# Patient Record
Sex: Male | Born: 1995 | Race: Black or African American | Hispanic: No | Marital: Single | Smoking: Current every day smoker
Health system: Southern US, Community
[De-identification: ages and names within clinical notes are randomized; demographics above are authoritative.]

## PROBLEM LIST (undated history)

## (undated) DIAGNOSIS — A64 Unspecified sexually transmitted disease: Secondary | ICD-10-CM

---
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
## 2017-11-02 ENCOUNTER — Emergency Department (HOSPITAL_COMMUNITY)
Admission: EM | Admit: 2017-11-02 | Discharge: 2017-11-03 | Disposition: A | Discharge status: Home / Self Care | Payer: Self-pay | Attending: Emergency Medicine | Admitting: Emergency Medicine

## 2017-11-02 DIAGNOSIS — M5441 Lumbago with sciatica, right side: Secondary | ICD-10-CM | POA: Insufficient documentation

## 2017-11-02 DIAGNOSIS — R300 Dysuria: Secondary | ICD-10-CM | POA: Insufficient documentation

## 2017-11-02 DIAGNOSIS — M5442 Lumbago with sciatica, left side: Secondary | ICD-10-CM | POA: Insufficient documentation

## 2017-11-02 DIAGNOSIS — G8929 Other chronic pain: Secondary | ICD-10-CM

## 2017-11-02 DIAGNOSIS — F17210 Nicotine dependence, cigarettes, uncomplicated: Secondary | ICD-10-CM | POA: Insufficient documentation

## 2017-11-02 HISTORY — DX: Unspecified sexually transmitted disease: A64

## 2017-11-03 ENCOUNTER — Encounter (HOSPITAL_COMMUNITY): Payer: Self-pay | Admitting: Emergency Medicine

## 2017-11-03 LAB — CBC
HEMATOCRIT: 47.5 % (ref 39.0–52.0)
Hemoglobin: 15.8 g/dL (ref 13.0–17.0)
MCH: 32.2 pg (ref 26.0–34.0)
MCHC: 33.3 g/dL (ref 30.0–36.0)
MCV: 96.7 fL (ref 78.0–100.0)
PLATELETS: 295 10*3/uL (ref 150–400)
RBC: 4.91 MIL/uL (ref 4.22–5.81)
RDW: 12.2 % (ref 11.5–15.5)
WBC: 10.3 10*3/uL (ref 4.0–10.5)

## 2017-11-03 LAB — URINALYSIS, ROUTINE W REFLEX MICROSCOPIC
BACTERIA UA: NONE SEEN
Bilirubin Urine: NEGATIVE
GLUCOSE, UA: NEGATIVE mg/dL
KETONES UR: 5 mg/dL — AB
Nitrite: NEGATIVE
PH: 6 (ref 5.0–8.0)
PROTEIN: 30 mg/dL — AB
Specific Gravity, Urine: 1.032 — ABNORMAL HIGH (ref 1.005–1.030)

## 2017-11-03 LAB — COMPREHENSIVE METABOLIC PANEL
ALT: 11 U/L (ref 0–44)
ANION GAP: 9 (ref 5–15)
AST: 20 U/L (ref 15–41)
Albumin: 4.1 g/dL (ref 3.5–5.0)
Alkaline Phosphatase: 69 U/L (ref 38–126)
BILIRUBIN TOTAL: 1.1 mg/dL (ref 0.3–1.2)
BUN: 11 mg/dL (ref 6–20)
CHLORIDE: 105 mmol/L (ref 98–111)
CO2: 26 mmol/L (ref 22–32)
Calcium: 8.9 mg/dL (ref 8.9–10.3)
Creatinine, Ser: 1.19 mg/dL (ref 0.61–1.24)
Glucose, Bld: 91 mg/dL (ref 70–99)
Potassium: 3.8 mmol/L (ref 3.5–5.1)
Sodium: 140 mmol/L (ref 135–145)
TOTAL PROTEIN: 7.3 g/dL (ref 6.5–8.1)

## 2017-11-03 LAB — LIPASE, BLOOD: Lipase: 24 U/L (ref 11–51)

## 2017-11-03 MED ORDER — CEFTRIAXONE SODIUM 250 MG IJ SOLR
250.0000 mg | Freq: Once | INTRAMUSCULAR | Status: AC
  Administered 2017-11-03: 250 mg via INTRAMUSCULAR
  Filled 2017-11-03: qty 250

## 2017-11-03 MED ORDER — CEPHALEXIN 500 MG PO CAPS: 500.0000 mg | ORAL_CAPSULE | Freq: Two times a day (BID) | ORAL | 0 refills | Status: DC

## 2017-11-03 MED ORDER — STERILE WATER FOR INJECTION IJ SOLN
INTRAMUSCULAR | Status: AC
  Administered 2017-11-03: 0.9 mL
  Filled 2017-11-03: qty 10

## 2017-11-03 MED ORDER — AZITHROMYCIN 250 MG PO TABS
1000.0000 mg | ORAL_TABLET | Freq: Once | ORAL | Status: AC
  Administered 2017-11-03: 1000 mg via ORAL
  Filled 2017-11-03: qty 4
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
## 2017-12-22 ENCOUNTER — Emergency Department (HOSPITAL_COMMUNITY): Payer: 59

## 2017-12-22 ENCOUNTER — Encounter (HOSPITAL_COMMUNITY): Payer: Self-pay | Admitting: *Deleted

## 2017-12-22 ENCOUNTER — Observation Stay (HOSPITAL_COMMUNITY)
Admission: EM | Admit: 2017-12-22 | Discharge: 2017-12-23 | Disposition: A | Discharge status: Home / Self Care | Payer: 59 | Attending: General Surgery | Admitting: General Surgery

## 2017-12-22 ENCOUNTER — Emergency Department (HOSPITAL_COMMUNITY): Payer: 59 | Admitting: Certified Registered"

## 2017-12-22 ENCOUNTER — Encounter (HOSPITAL_COMMUNITY): Admission: EM | Disposition: A | Discharge status: Home / Self Care | Payer: Self-pay | Source: Home / Self Care

## 2017-12-22 DIAGNOSIS — W3400XA Accidental discharge from unspecified firearms or gun, initial encounter: Secondary | ICD-10-CM | POA: Diagnosis not present

## 2017-12-22 DIAGNOSIS — F17210 Nicotine dependence, cigarettes, uncomplicated: Secondary | ICD-10-CM | POA: Diagnosis not present

## 2017-12-22 DIAGNOSIS — S31134A Puncture wound of abdominal wall without foreign body, left lower quadrant without penetration into peritoneal cavity, initial encounter: Secondary | ICD-10-CM | POA: Diagnosis present

## 2017-12-22 HISTORY — PX: LAPAROSCOPY: SHX197

## 2017-12-22 LAB — COMPREHENSIVE METABOLIC PANEL
ALT: 21 U/L (ref 0–44)
AST: 25 U/L (ref 15–41)
Albumin: 4.2 g/dL (ref 3.5–5.0)
Alkaline Phosphatase: 73 U/L (ref 38–126)
Anion gap: 15 (ref 5–15)
BUN: 10 mg/dL (ref 6–20)
CHLORIDE: 104 mmol/L (ref 98–111)
CO2: 21 mmol/L — ABNORMAL LOW (ref 22–32)
Calcium: 8.9 mg/dL (ref 8.9–10.3)
Creatinine, Ser: 1.18 mg/dL (ref 0.61–1.24)
Glucose, Bld: 118 mg/dL — ABNORMAL HIGH (ref 70–99)
POTASSIUM: 3.1 mmol/L — AB (ref 3.5–5.1)
SODIUM: 140 mmol/L (ref 135–145)
Total Bilirubin: 0.8 mg/dL (ref 0.3–1.2)
Total Protein: 7.7 g/dL (ref 6.5–8.1)

## 2017-12-22 LAB — PROTIME-INR
INR: 0.97
PROTHROMBIN TIME: 12.8 s (ref 11.4–15.2)

## 2017-12-22 LAB — I-STAT CHEM 8, ED
BUN: 10 mg/dL (ref 6–20)
CREATININE: 1 mg/dL (ref 0.61–1.24)
Calcium, Ion: 1.11 mmol/L — ABNORMAL LOW (ref 1.15–1.40)
Chloride: 104 mmol/L (ref 98–111)
GLUCOSE: 117 mg/dL — AB (ref 70–99)
HCT: 50 % (ref 39.0–52.0)
HEMOGLOBIN: 17 g/dL (ref 13.0–17.0)
Potassium: 2.9 mmol/L — ABNORMAL LOW (ref 3.5–5.1)
Sodium: 143 mmol/L (ref 135–145)
TCO2: 22 mmol/L (ref 22–32)

## 2017-12-22 LAB — CBC
HEMATOCRIT: 48.1 % (ref 39.0–52.0)
HEMOGLOBIN: 15.8 g/dL (ref 13.0–17.0)
MCH: 32.4 pg (ref 26.0–34.0)
MCHC: 32.8 g/dL (ref 30.0–36.0)
MCV: 98.6 fL (ref 78.0–100.0)
Platelets: 326 10*3/uL (ref 150–400)
RBC: 4.88 MIL/uL (ref 4.22–5.81)
RDW: 12.5 % (ref 11.5–15.5)
WBC: 12.2 10*3/uL — ABNORMAL HIGH (ref 4.0–10.5)

## 2017-12-22 LAB — I-STAT CG4 LACTIC ACID, ED: Lactic Acid, Venous: 5.57 mmol/L (ref 0.5–1.9)

## 2017-12-22 LAB — ABO/RH: ABO/RH(D): A POS

## 2017-12-22 LAB — ETHANOL: Alcohol, Ethyl (B): <10 mg/dL (ref <10)

## 2017-12-22 SURGERY — LAPAROSCOPY, DIAGNOSTIC
Anesthesia: General | Site: Abdomen

## 2017-12-22 MED ORDER — SODIUM CHLORIDE 0.9 % IV SOLN
2.0000 g | Freq: Once | INTRAVENOUS | Status: AC
  Administered 2017-12-22: 2 g via INTRAVENOUS
  Filled 2017-12-22: qty 2

## 2017-12-22 MED ORDER — ONDANSETRON HCL 4 MG/2ML IJ SOLN
INTRAMUSCULAR | Status: DC | PRN
  Administered 2017-12-22: 4 mg via INTRAVENOUS

## 2017-12-22 MED ORDER — TETANUS-DIPHTH-ACELL PERTUSSIS 5-2.5-18.5 LF-MCG/0.5 IM SUSP
INTRAMUSCULAR | Status: AC
  Filled 2017-12-22: qty 0.5

## 2017-12-22 MED ORDER — TETANUS-DIPHTH-ACELL PERTUSSIS 5-2.5-18.5 LF-MCG/0.5 IM SUSP
INTRAMUSCULAR | Status: AC
  Administered 2017-12-22: 0.5 mL via INTRAMUSCULAR
  Filled 2017-12-22: qty 0.5

## 2017-12-22 MED ORDER — FENTANYL CITRATE (PF) 250 MCG/5ML IJ SOLN
INTRAMUSCULAR | Status: AC
  Filled 2017-12-22: qty 5

## 2017-12-22 MED ORDER — FENTANYL CITRATE (PF) 250 MCG/5ML IJ SOLN
INTRAMUSCULAR | Status: DC | PRN
  Administered 2017-12-22 (×2): 50 ug via INTRAVENOUS

## 2017-12-22 MED ORDER — MIDAZOLAM HCL 2 MG/2ML IJ SOLN
INTRAMUSCULAR | Status: AC
  Filled 2017-12-22: qty 2

## 2017-12-22 MED ORDER — MIDAZOLAM HCL 5 MG/5ML IJ SOLN
INTRAMUSCULAR | Status: DC | PRN
  Administered 2017-12-22: 2 mg via INTRAVENOUS

## 2017-12-22 MED ORDER — SUCCINYLCHOLINE CHLORIDE 20 MG/ML IJ SOLN
INTRAMUSCULAR | Status: DC | PRN
  Administered 2017-12-22: 120 mg via INTRAVENOUS

## 2017-12-22 MED ORDER — ACETAMINOPHEN 10 MG/ML IV SOLN
INTRAVENOUS | Status: DC | PRN
  Administered 2017-12-22: 1000 mg via INTRAVENOUS

## 2017-12-22 MED ORDER — LACTATED RINGERS IV SOLN
INTRAVENOUS | Status: DC | PRN
  Administered 2017-12-22: 23:00:00 via INTRAVENOUS

## 2017-12-22 MED ORDER — PROPOFOL 10 MG/ML IV BOLUS
INTRAVENOUS | Status: DC | PRN
  Administered 2017-12-22: 160 mg via INTRAVENOUS

## 2017-12-22 MED ORDER — 0.9 % SODIUM CHLORIDE (POUR BTL) OPTIME
TOPICAL | Status: DC | PRN
  Administered 2017-12-22: 1000 mL

## 2017-12-22 MED ORDER — PROPOFOL 10 MG/ML IV BOLUS
INTRAVENOUS | Status: AC
  Filled 2017-12-22: qty 20

## 2017-12-22 MED ORDER — ROCURONIUM BROMIDE 100 MG/10ML IV SOLN
INTRAVENOUS | Status: DC | PRN
  Administered 2017-12-22: 30 mg via INTRAVENOUS

## 2017-12-22 MED ORDER — BUPIVACAINE HCL 0.25 % IJ SOLN
INTRAMUSCULAR | Status: DC | PRN
  Administered 2017-12-22: 5 mL

## 2017-12-22 MED ORDER — LIDOCAINE HCL (CARDIAC) PF 100 MG/5ML IV SOSY
PREFILLED_SYRINGE | INTRAVENOUS | Status: DC | PRN
  Administered 2017-12-22: 60 mg via INTRATRACHEAL

## 2017-12-22 MED ORDER — FENTANYL CITRATE (PF) 100 MCG/2ML IJ SOLN
INTRAMUSCULAR | Status: AC
  Administered 2017-12-22: 50 ug
  Filled 2017-12-22: qty 2

## 2017-12-22 MED ORDER — SUGAMMADEX SODIUM 200 MG/2ML IV SOLN
INTRAVENOUS | Status: DC | PRN
  Administered 2017-12-22: 200 mg via INTRAVENOUS

## 2017-12-22 SURGICAL SUPPLY — 44 items
ADH SKN CLS APL DERMABOND .7 (GAUZE/BANDAGES/DRESSINGS) ×1
APL SKNCLS STERI-STRIP NONHPOA (GAUZE/BANDAGES/DRESSINGS) ×1
BENZOIN TINCTURE PRP APPL 2/3 (GAUZE/BANDAGES/DRESSINGS) ×3 IMPLANT
BLADE CLIPPER SURG (BLADE) IMPLANT
CANISTER SUCT 3000ML PPV (MISCELLANEOUS) IMPLANT
CHLORAPREP W/TINT 26ML (MISCELLANEOUS) ×3 IMPLANT
COVER SURGICAL LIGHT HANDLE (MISCELLANEOUS) ×3 IMPLANT
DEFOGGER SCOPE WARMER CLEARIFY (MISCELLANEOUS) ×3 IMPLANT
DERMABOND ADVANCED (GAUZE/BANDAGES/DRESSINGS) ×2
DERMABOND ADVANCED .7 DNX12 (GAUZE/BANDAGES/DRESSINGS) IMPLANT
DRAPE WARM FLUID 44X44 (DRAPE) ×3 IMPLANT
ELECT REM PT RETURN 9FT ADLT (ELECTROSURGICAL) ×3
ELECTRODE REM PT RTRN 9FT ADLT (ELECTROSURGICAL) ×1 IMPLANT
GAUZE SPONGE 2X2 8PLY STRL LF (GAUZE/BANDAGES/DRESSINGS) ×1 IMPLANT
GAUZE SPONGE 4X4 12PLY STRL (GAUZE/BANDAGES/DRESSINGS) ×2 IMPLANT
GLOVE BIO SURGEON STRL SZ7.5 (GLOVE) ×5 IMPLANT
GOWN STRL REUS W/ TWL LRG LVL3 (GOWN DISPOSABLE) ×2 IMPLANT
GOWN STRL REUS W/ TWL XL LVL3 (GOWN DISPOSABLE) ×1 IMPLANT
GOWN STRL REUS W/TWL LRG LVL3 (GOWN DISPOSABLE) ×6
GOWN STRL REUS W/TWL XL LVL3 (GOWN DISPOSABLE) ×3
KIT BASIN OR (CUSTOM PROCEDURE TRAY) ×3 IMPLANT
KIT TURNOVER KIT B (KITS) ×3 IMPLANT
NDL INSUFFLATION 14GA 120MM (NEEDLE) ×1 IMPLANT
NEEDLE INSUFFLATION 14GA 120MM (NEEDLE) ×6 IMPLANT
NS IRRIG 1000ML POUR BTL (IV SOLUTION) ×3 IMPLANT
PAD ARMBOARD 7.5X6 YLW CONV (MISCELLANEOUS) ×6 IMPLANT
PENCIL BUTTON HOLSTER BLD 10FT (ELECTRODE) ×2 IMPLANT
SCISSORS LAP 5X35 DISP (ENDOMECHANICALS) IMPLANT
SET IRRIG TUBING LAPAROSCOPIC (IRRIGATION / IRRIGATOR) IMPLANT
SET TUBE SMOKE EVAC HIGH FLOW (TUBING) ×2 IMPLANT
SLEEVE ENDOPATH XCEL 5M (ENDOMECHANICALS) ×5 IMPLANT
SPONGE GAUZE 2X2 STER 10/PKG (GAUZE/BANDAGES/DRESSINGS) ×2
SUT MNCRL AB 4-0 PS2 18 (SUTURE) ×5 IMPLANT
TOWEL OR 17X24 6PK STRL BLUE (TOWEL DISPOSABLE) ×3 IMPLANT
TOWEL OR 17X26 10 PK STRL BLUE (TOWEL DISPOSABLE) ×3 IMPLANT
TRAY LAPAROSCOPIC MC (CUSTOM PROCEDURE TRAY) ×3 IMPLANT
TROCAR XCEL 12X100 BLDLESS (ENDOMECHANICALS) IMPLANT
TROCAR XCEL BLUNT TIP 100MML (ENDOMECHANICALS) IMPLANT
TROCAR XCEL NON-BLD 11X100MML (ENDOMECHANICALS) IMPLANT
TROCAR XCEL NON-BLD 5MMX100MML (ENDOMECHANICALS) ×6 IMPLANT
TUBE CONNECTING 12'X1/4 (SUCTIONS) ×1
TUBE CONNECTING 12X1/4 (SUCTIONS) ×1 IMPLANT
TUBING INSUFFLATION (TUBING) ×3 IMPLANT
YANKAUER SUCT BULB TIP NO VENT (SUCTIONS) ×3 IMPLANT

## 2017-12-22 NOTE — Anesthesia Procedure Notes (Addendum)
Procedure Name: Intubation Date/Time: 12/22/2017 11:28 PM Performed by: Claudina LickMahony, Corinthian Mizrahi D, CRNA Pre-anesthesia Checklist: Patient identified, Emergency Drugs available, Suction available, Patient being monitored and Timeout performed Patient Re-evaluated:Patient Re-evaluated prior to induction Oxygen Delivery Method: Circle system utilized Preoxygenation: Pre-oxygenation with 100% oxygen Induction Type: IV induction, Rapid sequence and Cricoid Pressure applied Laryngoscope Size: Miller and 2 Grade View: Grade I Tube type: Subglottic suction tube Tube size: 7.5 mm Number of attempts: 1 Airway Equipment and Method: Stylet Placement Confirmation: ETT inserted through vocal cords under direct vision,  positive ETCO2 and breath sounds checked- equal and bilateral Secured at: 22 cm Tube secured with: Tape Dental Injury: Teeth and Oropharynx as per pre-operative assessment
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
## 2017-12-22 NOTE — Anesthesia Preprocedure Evaluation (Addendum)
Anesthesia Evaluation  Patient identified by MRN, date of birth, ID band Patient awake    Reviewed: Allergy & Precautions, Patient's Chart, lab work & pertinent test resultsPreop documentation limited or incomplete due to emergent nature of procedure.  History of Anesthesia Complications Negative for: history of anesthetic complications  Airway Mallampati: II  TM Distance: >3 FB Neck ROM: Full    Dental  (+) Teeth Intact, Dental Advisory Given   Pulmonary neg pulmonary ROS, Current Smoker,           Cardiovascular negative cardio ROS       Neuro/Psych negative neurological ROS  negative psych ROS   GI/Hepatic Neg liver ROS,   Endo/Other  negative endocrine ROS  Renal/GU negative Renal ROS  negative genitourinary   Musculoskeletal negative musculoskeletal ROS (+)   Abdominal   Peds negative pediatric ROS (+)  Hematology negative hematology ROS (+)   Anesthesia Other Findings   Reproductive/Obstetrics negative OB ROS                            Anesthesia Physical Anesthesia Plan  ASA: III and emergent  Anesthesia Plan: General   Post-op Pain Management:    Induction: Intravenous, Rapid sequence and Cricoid pressure planned  PONV Risk Score and Plan: 2 and Ondansetron and Dexamethasone  Airway Management Planned: Oral ETT  Additional Equipment:   Intra-op Plan:   Post-operative Plan: Extubation in OR  Informed Consent: I have reviewed the patients History and Physical, chart, labs and discussed the procedure including the risks, benefits and alternatives for the proposed anesthesia with the patient or authorized representative who has indicated his/her understanding and acceptance.   Dental advisory given  Plan Discussed with: Anesthesiologist, CRNA and Surgeon  Anesthesia Plan Comments:        Anesthesia Quick Evaluation

## 2017-12-22 NOTE — ED Notes (Signed)
Password of [phone] given to mother at bedside
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
## 2017-12-23 ENCOUNTER — Encounter (HOSPITAL_COMMUNITY): Payer: Self-pay | Admitting: General Surgery

## 2017-12-23 ENCOUNTER — Other Ambulatory Visit: Payer: Self-pay

## 2017-12-23 DIAGNOSIS — Y249XXA Unspecified firearm discharge, undetermined intent, initial encounter: Secondary | ICD-10-CM

## 2017-12-23 DIAGNOSIS — W3400XA Accidental discharge from unspecified firearms or gun, initial encounter: Secondary | ICD-10-CM

## 2017-12-23 LAB — BPAM RBC
BLOOD PRODUCT EXPIRATION DATE: 201909102359
BLOOD PRODUCT EXPIRATION DATE: 201909112359
ISSUE DATE / TIME: 201908132236
ISSUE DATE / TIME: 201908132236
UNIT TYPE AND RH: 9500
Unit Type and Rh: 9500

## 2017-12-23 LAB — CBC
HEMATOCRIT: 44.5 % (ref 39.0–52.0)
Hemoglobin: 15 g/dL (ref 13.0–17.0)
MCH: 32.1 pg (ref 26.0–34.0)
MCHC: 33.7 g/dL (ref 30.0–36.0)
MCV: 95.1 fL (ref 78.0–100.0)
Platelets: 221 10*3/uL (ref 150–400)
RBC: 4.68 MIL/uL (ref 4.22–5.81)
RDW: 12.4 % (ref 11.5–15.5)
WBC: 11.9 10*3/uL — AB (ref 4.0–10.5)

## 2017-12-23 LAB — TYPE AND SCREEN
ABO/RH(D): A POS
ANTIBODY SCREEN: NEGATIVE
UNIT DIVISION: 0
Unit division: 0

## 2017-12-23 LAB — BPAM FFP
BLOOD PRODUCT EXPIRATION DATE: 201908312359
Blood Product Expiration Date: 201908202359
ISSUE DATE / TIME: 201908132237
ISSUE DATE / TIME: 201908132237
UNIT TYPE AND RH: 6200
Unit Type and Rh: 6200

## 2017-12-23 LAB — PREPARE FRESH FROZEN PLASMA
UNIT DIVISION: 0
Unit division: 0

## 2017-12-23 LAB — LACTIC ACID, PLASMA: Lactic Acid, Venous: 1.4 mmol/L (ref 0.5–1.9)

## 2017-12-23 LAB — CDS SEROLOGY

## 2017-12-23 MED ORDER — ACETAMINOPHEN 325 MG PO TABS: 650.0000 mg | ORAL_TABLET | Freq: Four times a day (QID) | ORAL | Status: AC | PRN

## 2017-12-23 MED ORDER — ONDANSETRON 4 MG PO TBDP: 4.0000 mg | ORAL_TABLET | Freq: Four times a day (QID) | ORAL | Status: DC | PRN

## 2017-12-23 MED ORDER — OXYCODONE HCL 5 MG PO TABS: 5.0000 mg | ORAL_TABLET | Freq: Four times a day (QID) | ORAL | 0 refills | Status: AC | PRN

## 2017-12-23 MED ORDER — PROMETHAZINE HCL 25 MG/ML IJ SOLN: 6.2500 mg | INTRAMUSCULAR | Status: DC | PRN

## 2017-12-23 MED ORDER — ONDANSETRON HCL 4 MG/2ML IJ SOLN: 4.0000 mg | Freq: Four times a day (QID) | INTRAMUSCULAR | Status: DC | PRN

## 2017-12-23 MED ORDER — DEXTROSE-NACL 5-0.9 % IV SOLN
INTRAVENOUS | Status: DC
  Administered 2017-12-23: 03:00:00 via INTRAVENOUS

## 2017-12-23 MED ORDER — ENOXAPARIN SODIUM 40 MG/0.4ML ~~LOC~~ SOLN: 40.0000 mg | SUBCUTANEOUS | Status: DC

## 2017-12-23 MED ORDER — HYDROMORPHONE HCL 1 MG/ML IJ SOLN
INTRAMUSCULAR | Status: AC
  Filled 2017-12-23: qty 1

## 2017-12-23 MED ORDER — ACETAMINOPHEN 325 MG PO TABS: 650.0000 mg | ORAL_TABLET | Freq: Four times a day (QID) | ORAL | Status: DC | PRN

## 2017-12-23 MED ORDER — TRAMADOL HCL 50 MG PO TABS
50.0000 mg | ORAL_TABLET | Freq: Four times a day (QID) | ORAL | Status: DC | PRN
  Administered 2017-12-23: 50 mg via ORAL
  Filled 2017-12-23: qty 1

## 2017-12-23 MED ORDER — HYDROMORPHONE HCL 1 MG/ML IJ SOLN
0.2500 mg | INTRAMUSCULAR | Status: DC | PRN
  Administered 2017-12-23: 0.5 mg via INTRAVENOUS

## 2017-12-23 MED ORDER — KETOROLAC TROMETHAMINE 30 MG/ML IJ SOLN
30.0000 mg | Freq: Four times a day (QID) | INTRAMUSCULAR | Status: DC | PRN
  Administered 2017-12-23: 30 mg via INTRAVENOUS
  Filled 2017-12-23: qty 1

## 2017-12-23 MED ORDER — OXYCODONE HCL 5 MG PO TABS: 5.0000 mg | ORAL_TABLET | Freq: Four times a day (QID) | ORAL | Status: DC | PRN

## 2017-12-23 NOTE — Progress Notes (Signed)
Discharge instructions (including medications) discussed with and copy provided to patient and mother. All belongings sent with patient.

## 2017-12-23 NOTE — Discharge Instructions (Signed)
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
## 2017-12-23 NOTE — Anesthesia Postprocedure Evaluation (Signed)
Anesthesia Post Note  Patient: Genene ChurnMarquel J XXXMaynard  Procedure(s) Performed: LAPAROSCOPY DIAGNOSTIC (N/A Abdomen)     Patient location during evaluation: PACU Anesthesia Type: General Level of consciousness: sedated Pain management: pain level controlled Vital Signs Assessment: post-procedure vital signs reviewed and stable Respiratory status: spontaneous breathing and respiratory function stable Cardiovascular status: stable Postop Assessment: no apparent nausea or vomiting Anesthetic complications: no                 Jahmeek Shirk DANIEL

## 2017-12-23 NOTE — Transfer of Care (Signed)
Immediate Anesthesia Transfer of Care Note  Patient: Jackson Ramirez  Procedure(s) Performed: LAPAROSCOPY DIAGNOSTIC (N/A Abdomen)  Patient Location: PACU  Anesthesia Type:General  Level of Consciousness: awake  Airway & Oxygen Therapy: Patient Spontanous Breathing  Post-op Assessment: Report given to RN and Post -op Vital signs reviewed and stable  Post vital signs: Reviewed and stable  Last Vitals:  Vitals Value Taken Time  BP 143/89 12/23/2017 12:05 AM  Temp    Pulse 85 12/23/2017 12:06 AM  Resp 27 12/23/2017 12:06 AM  SpO2 100 % 12/23/2017 12:06 AM  Vitals shown include unvalidated device data.  Last Pain:  Vitals:   12/22/17 2239  TempSrc:   PainSc: 8          Complications: No apparent anesthesia complications
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
## 2020-05-17 ENCOUNTER — Other Ambulatory Visit: Payer: Self-pay

## 2024-02-17 ENCOUNTER — Encounter (HOSPITAL_COMMUNITY): Payer: Self-pay

## 2024-02-17 ENCOUNTER — Emergency Department (HOSPITAL_COMMUNITY): Payer: MEDICAID

## 2024-02-17 ENCOUNTER — Other Ambulatory Visit: Payer: Self-pay

## 2024-02-17 ENCOUNTER — Emergency Department (HOSPITAL_COMMUNITY)
Admission: EM | Admit: 2024-02-17 | Discharge: 2024-02-17 | Disposition: A | Discharge status: Home / Self Care | Payer: MEDICAID

## 2024-02-17 DIAGNOSIS — D72829 Elevated white blood cell count, unspecified: Secondary | ICD-10-CM | POA: Insufficient documentation

## 2024-02-17 DIAGNOSIS — R0789 Other chest pain: Secondary | ICD-10-CM | POA: Diagnosis present

## 2024-02-17 LAB — TROPONIN I (HIGH SENSITIVITY)
Troponin I (High Sensitivity): 5 ng/L (ref <18)
Troponin I (High Sensitivity): 4 ng/L (ref <18)

## 2024-02-17 LAB — CBC
HCT: 44.9 % (ref 39.0–52.0)
Hemoglobin: 14.9 g/dL (ref 13.0–17.0)
MCH: 31.6 pg (ref 26.0–34.0)
MCHC: 33.2 g/dL (ref 30.0–36.0)
MCV: 95.1 fL (ref 80.0–100.0)
Platelets: 347 K/uL (ref 150–400)
RBC: 4.72 MIL/uL (ref 4.22–5.81)
RDW: 12.7 % (ref 11.5–15.5)
WBC: 11.8 K/uL — ABNORMAL HIGH (ref 4.0–10.5)
nRBC: 0 % (ref 0.0–0.2)

## 2024-02-17 LAB — BASIC METABOLIC PANEL WITH GFR
Anion gap: 9 (ref 5–15)
BUN: 11 mg/dL (ref 6–20)
CO2: 24 mmol/L (ref 22–32)
Calcium: 8.7 mg/dL — ABNORMAL LOW (ref 8.9–10.3)
Chloride: 104 mmol/L (ref 98–111)
Creatinine, Ser: 0.92 mg/dL (ref 0.61–1.24)
GFR, Estimated: >60 mL/min (ref >60)
Glucose, Bld: 93 mg/dL (ref 70–99)
Potassium: 4 mmol/L (ref 3.5–5.1)
Sodium: 137 mmol/L (ref 135–145)

## 2024-02-17 MED ORDER — IOHEXOL 350 MG/ML SOLN
75.0000 mL | Freq: Once | INTRAVENOUS | Status: AC | PRN
  Administered 2024-02-17: 75 mL via INTRAVENOUS

## 2024-02-17 NOTE — ED Triage Notes (Signed)
 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 He is on blood thinners, currently as well. Pt is otherwise stable at this time.

## 2024-02-17 NOTE — ED Provider Notes (Signed)
 [place] EMERGENCY DEPARTMENT AT [facility] Provider Note   CSN: [id number] Arrival date & time: 02/17/24  0109     Patient presents with: Chest Pain   EWARD RUTIGLIANO is a 28 y.o. male.   28 year old male presents for evaluation of chest pain.  States has been going on for a few days is located in the center of his chest and nonradiating.  States it is constant and sharp.  He states he has a history of PE and is concerned he has another 1.  Denies any other systems or concerns at this time.   Chest Pain Associated symptoms: no abdominal pain, no back pain, no cough, no fever, no palpitations, no shortness of breath and no vomiting        Prior to Admission medications   Medication Sig Start Date End Date Taking? Authorizing Provider  acetaminophen  (TYLENOL ) 325 MG tablet Take 2 tablets (650 mg total) by mouth every 6 (six) hours as needed for mild pain. 12/23/17   Meuth, Brooke A, PA-C  oxyCODONE  (OXY IR/ROXICODONE ) 5 MG immediate release tablet Take 1 tablet (5 mg total) by mouth every 6 (six) hours as needed for severe pain. 12/23/17   Meuth, Lyle LABOR, PA-C    Allergies: Patient has no known allergies.    Review of Systems  Constitutional:  Negative for chills and fever.  HENT:  Negative for ear pain and sore throat.   Eyes:  Negative for pain and visual disturbance.  Respiratory:  Negative for cough and shortness of breath.   Cardiovascular:  Positive for chest pain. Negative for palpitations.  Gastrointestinal:  Negative for abdominal pain and vomiting.  Genitourinary:  Negative for dysuria and hematuria.  Musculoskeletal:  Negative for arthralgias and back pain.  Skin:  Negative for color change and rash.  Neurological:  Negative for seizures and syncope.  All other systems reviewed and are negative.   Updated Vital Signs BP 116/79 (BP Location: Right Arm)   Pulse 78   Temp 97.9 F (36.6 C)   Resp 17   SpO2 95%   Physical Exam Vitals and  nursing note reviewed.  Constitutional:      General: He is not in acute distress.    Appearance: He is well-developed. He is not ill-appearing.  HENT:     Head: Normocephalic and atraumatic.  Eyes:     Conjunctiva/sclera: Conjunctivae normal.  Cardiovascular:     Rate and Rhythm: Normal rate and regular rhythm.     Heart sounds: No murmur heard. Pulmonary:     Effort: Pulmonary effort is normal. No respiratory distress.     Breath sounds: Normal breath sounds.  Abdominal:     Palpations: Abdomen is soft.     Tenderness: There is no abdominal tenderness.  Musculoskeletal:        General: No swelling.     Cervical back: Neck supple.  Skin:    General: Skin is warm and dry.     Capillary Refill: Capillary refill takes less than 2 seconds.  Neurological:     Mental Status: He is alert.  Psychiatric:        Mood and Affect: Mood normal.     (all labs ordered are listed, but only abnormal results are displayed) Labs Reviewed  BASIC METABOLIC PANEL WITH GFR - Abnormal; Notable for the following components:      Result Value   Calcium 8.7 (*)    All other components within normal limits  CBC -  Abnormal; Notable for the following components:   WBC 11.8 (*)    All other components within normal limits  TROPONIN I (HIGH SENSITIVITY)  TROPONIN I (HIGH SENSITIVITY)    EKG: EKG Interpretation Date/Time:  Wednesday February 17 2024 01:17:37 EDT Ventricular Rate:  89 PR Interval:  172 QRS Duration:  84 QT Interval:  344 QTC Calculation: 418 R Axis:   6  Text Interpretation: Normal sinus rhythm  Age indeterminate anterior infarct Compared with previous EKG from 10/03/2013 Confirmed by Gennaro Bouchard ([id number]) on 02/17/2024 7:23:40 AM  Radiology: CT Angio Chest Pulmonary Embolism (PE) W or WO Contrast Result Date: 02/17/2024 CLINICAL DATA:  Chest pain.  Prior pulmonary embolism. EXAM: CT ANGIOGRAPHY CHEST WITH CONTRAST TECHNIQUE: Multidetector CT imaging of the chest was  performed using the standard protocol during bolus administration of intravenous contrast. Multiplanar CT image reconstructions and MIPs were obtained to evaluate the vascular anatomy. RADIATION DOSE REDUCTION: This exam was performed according to the departmental dose-optimization program which includes automated exposure control, adjustment of the mA and/or kV according to patient size and/or use of iterative reconstruction technique. CONTRAST:  75mL OMNIPAQUE IOHEXOL 350 MG/ML SOLN COMPARISON:  None Available. FINDINGS: Cardiovascular: Satisfactory opacification of pulmonary arteries noted, however, respiratory motion artifact limits evaluation. No acute pulmonary emboli identified. No evidence of thoracic aortic dissection or aneurysm. Mediastinum/Nodes: No masses or pathologically enlarged lymph nodes identified. Lungs/Pleura: No pulmonary mass, infiltrate, or effusion. Upper abdomen: No acute findings. Musculoskeletal: No suspicious bone lesions identified. Review of the MIP images confirms the above findings. IMPRESSION: Image degradation by respiratory motion artifact noted. No acute pulmonary embolism identified, or other active disease. Electronically Signed   By: Norleen DELENA Kil M.D.   On: 02/17/2024 06:09   DG Chest 2 View Result Date: 02/17/2024 CLINICAL DATA:  chest pain EXAM: CHEST - 2 VIEW COMPARISON:  Chest x-ray 12/22/2017 FINDINGS: The heart and mediastinal contours are within normal limits. No focal consolidation. No pulmonary edema. No pleural effusion. No pneumothorax. No acute osseous abnormality. IMPRESSION: No active cardiopulmonary disease. Electronically Signed   By: Morgane  Naveau M.D.   On: 02/17/2024 01:33     Procedures   Medications Ordered in the ED  iohexol (OMNIPAQUE) 350 MG/ML injection 75 mL (75 mLs Intravenous Contrast Given 02/17/24 0554)                                    Medical Decision Making Patient here for chest pain for few days.  States he drink quite a  bit before this occurred.  CT angiogram of the chest is negative, troponin labs EKG and chest x-ray otherwise unremarkable.  He is walking around the room appears well symptoms have overall improved.  Advised Tylenol  Motrin  as needed for pain, close follow-up with primary care and otherwise return to the ER for new or worsening.  He feels comfortable being discharged home  Problems Addressed: Atypical chest pain: acute illness or injury  Amount and/or Complexity of Data Reviewed External Data Reviewed: notes.    Details: No prior ED records reviewed Labs: ordered. Decision-making details documented in ED Course.    Details: Ordered and reviewed by me and unremarkable, troponin is negative Radiology: ordered and independent interpretation performed. Decision-making details documented in ED Course.    Details: Ordered and reviewed: CT angio of the chest is negative for PE or acute abnormality  Ordered and interpreted by me independently of radiology  Chest x-ray: Shows no acute abnormality in the chest ECG/medicine tests: ordered and independent interpretation performed. Decision-making details documented in ED Course.    Details: Ordered and interpreted by me independently of cardiology and shows early repolarization, sinus rhythm and no STEMI  Risk OTC drugs. Prescription drug management.     Final diagnoses:  Atypical chest pain    ED Discharge Orders     None          Gennaro Duwaine CROME, DO 02/17/24 0730

## 2024-02-17 NOTE — ED Provider Notes (Signed)
 Emergency Medicine Provider Triage Evaluation Note  Jackson Ramirez , a 28 y.o. male  was evaluated in triage.  Pt complains of chest pain, worse with deep breaths, feels similar to prior PE.  Missed a few days of his anticoagulant.  Review of Systems  Positive: Chest pain or shortness of breath Negative: Cough  Physical Exam  BP 116/79 (BP Location: Right Arm)   Pulse 78   Temp 97.9 F (36.6 C)   Resp 17   SpO2 95%  Gen:   Awake, no distress   Resp:  Normal effort  MSK:   Moves extremities without difficulty  Other:  None  Medical Decision Making  Medically screening exam initiated at 5:22 AM.  Appropriate orders placed.  Jackson Ramirez was informed that the remainder of the evaluation will be completed by another provider, this initial triage assessment does not replace that evaluation, and the importance of remaining in the ED until their evaluation is complete.     Jackson Ozell HERO, MD 02/17/24 [phone]

## 2024-02-17 NOTE — Discharge Instructions (Signed)
 Stay on your medications as prescribed.  You can use Tylenol  Motrin  as needed for pain.
